# Patient Record
Sex: Male | Born: 1960 | Race: White | Hispanic: No | Marital: Married
Health system: Southern US, Community
[De-identification: ages and names within clinical notes are randomized; demographics above are authoritative.]

---

## 2001-10-14 ENCOUNTER — Emergency Department (HOSPITAL_COMMUNITY): Admission: EM | Admit: 2001-10-14 | Discharge: 2001-10-14 | Payer: Self-pay | Admitting: Emergency Medicine

## 2001-10-14 ENCOUNTER — Encounter: Payer: Self-pay | Admitting: Emergency Medicine

## 2007-01-03 ENCOUNTER — Emergency Department: Payer: Self-pay | Admitting: Emergency Medicine

## 2008-02-02 ENCOUNTER — Emergency Department: Payer: Self-pay | Admitting: Emergency Medicine

## 2008-02-02 ENCOUNTER — Ambulatory Visit: Payer: Self-pay | Admitting: Family Medicine

## 2010-03-19 ENCOUNTER — Ambulatory Visit: Payer: Self-pay | Admitting: Internal Medicine

## 2011-11-13 ENCOUNTER — Inpatient Hospital Stay: Payer: Self-pay | Admitting: Psychiatry

## 2013-07-12 ENCOUNTER — Observation Stay: Payer: Self-pay | Admitting: Internal Medicine

## 2013-07-12 LAB — URINALYSIS, COMPLETE
Bacteria: NONE SEEN
Bilirubin,UR: NEGATIVE
Blood: NEGATIVE
Glucose,UR: NEGATIVE mg/dL (ref 0–75)
Leukocyte Esterase: NEGATIVE
Nitrite: NEGATIVE
Ph: 7 (ref 4.5–8.0)
Protein: NEGATIVE
RBC,UR: 1 /HPF (ref 0–5)
Specific Gravity: 1.015 (ref 1.003–1.030)
Squamous Epithelial: NONE SEEN
WBC UR: <1 /HPF (ref 0–5)

## 2013-07-12 LAB — PROTIME-INR
INR: 1
Prothrombin Time: 13.6 secs (ref 11.5–14.7)

## 2013-07-12 LAB — COMPREHENSIVE METABOLIC PANEL
Albumin: 4.4 g/dL (ref 3.4–5.0)
Alkaline Phosphatase: 90 U/L (ref 50–136)
Anion Gap: 11 (ref 7–16)
BUN: 12 mg/dL (ref 7–18)
Bilirubin,Total: 1.2 mg/dL — ABNORMAL HIGH (ref 0.2–1.0)
Calcium, Total: 9.4 mg/dL (ref 8.5–10.1)
Chloride: 106 mmol/L (ref 98–107)
Co2: 22 mmol/L (ref 21–32)
Creatinine: 1.3 mg/dL (ref 0.60–1.30)
EGFR (African American): >60
EGFR (Non-African Amer.): >60
Glucose: 112 mg/dL — ABNORMAL HIGH (ref 65–99)
Osmolality: 278 (ref 275–301)
Potassium: 3.2 mmol/L — ABNORMAL LOW (ref 3.5–5.1)
SGOT(AST): 19 U/L (ref 15–37)
SGPT (ALT): 21 U/L (ref 12–78)
Sodium: 139 mmol/L (ref 136–145)
Total Protein: 7.5 g/dL (ref 6.4–8.2)

## 2013-07-12 LAB — CBC
HCT: 44.2 % (ref 40.0–52.0)
HGB: 14.3 g/dL (ref 13.0–18.0)
MCH: 27.9 pg (ref 26.0–34.0)
MCHC: 32.4 g/dL (ref 32.0–36.0)
MCV: 86 fL (ref 80–100)
Platelet: 326 x10 3/mm 3 (ref 150–440)
RBC: 5.13 x10 6/mm 3 (ref 4.40–5.90)
RDW: 14 % (ref 11.5–14.5)
WBC: 17.7 x10 3/mm 3 — ABNORMAL HIGH (ref 3.8–10.6)

## 2013-07-12 LAB — CK TOTAL AND CKMB (NOT AT ARMC)
CK, Total: 65 U/L (ref 35–232)
CK-MB: <0.5 ng/mL — ABNORMAL LOW (ref 0.5–3.6)

## 2013-07-12 LAB — TSH: Thyroid Stimulating Horm: 0.751 u[IU]/mL

## 2013-07-12 LAB — MAGNESIUM: Magnesium: 1.5 mg/dL — ABNORMAL LOW

## 2013-07-12 LAB — TROPONIN I: Troponin-I: <0.02 ng/mL

## 2013-07-13 LAB — LIPID PANEL
Cholesterol: 169 mg/dL
HDL Cholesterol: 40 mg/dL
Ldl Cholesterol, Calc: 108 mg/dL — ABNORMAL HIGH
Triglycerides: 107 mg/dL
VLDL Cholesterol, Calc: 21 mg/dL

## 2013-07-13 LAB — CK TOTAL AND CKMB (NOT AT ARMC)
CK, Total: 58 U/L
CK-MB: <0.5 ng/mL — ABNORMAL LOW
CK-MB: 1 ng/mL (ref 0.5–3.6)

## 2013-07-13 LAB — TROPONIN I
Troponin-I: <0.02 ng/mL
Troponin-I: <0.02 ng/mL

## 2014-05-18 DEATH — deceased

## 2014-12-14 IMAGING — CT CT HEAD WITHOUT CONTRAST
1 series · 16 of 30 positions shown, 20 images · non-contrast
Comparison: none

REASON FOR EXAM: confusion
COMMENTS:

PROCEDURE:     CT  - CT HEAD WITHOUT CONTRAST  - July 12, 2013  [DATE]
RESULT:     Technique: Helical 5mm sections were obtained from the skull
base to the vertex without administration of intravenous contrast.

[Series 2: soft tissue · axial · 0.42mm/px · z∈[-173,-28]mm · 16 of 33 slices shown, 20 images]
[im 2/33  brain]
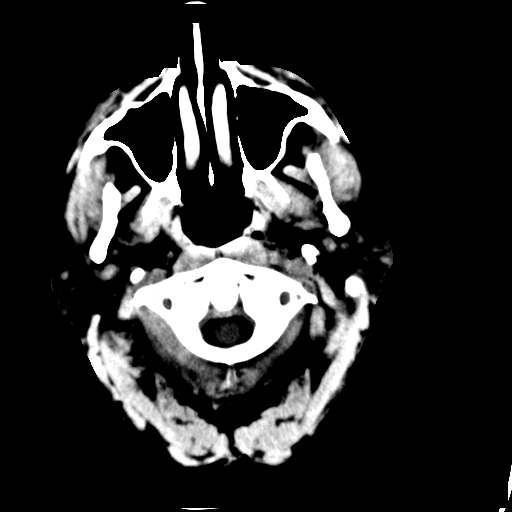
[im 2/33  bone]
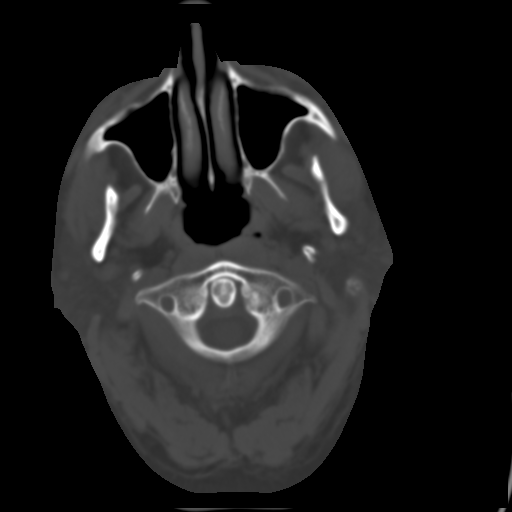
[im 4/33  brain]
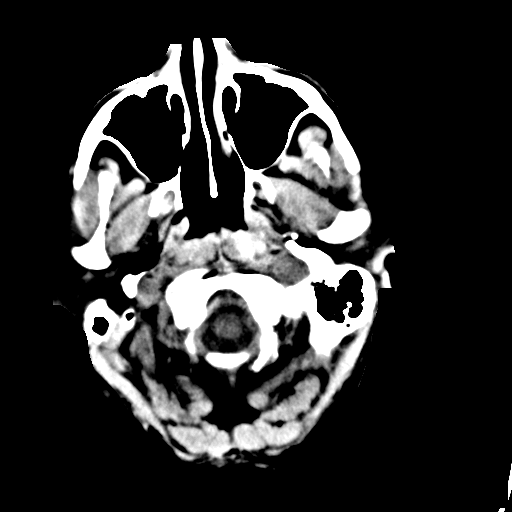
[im 6/33  brain]
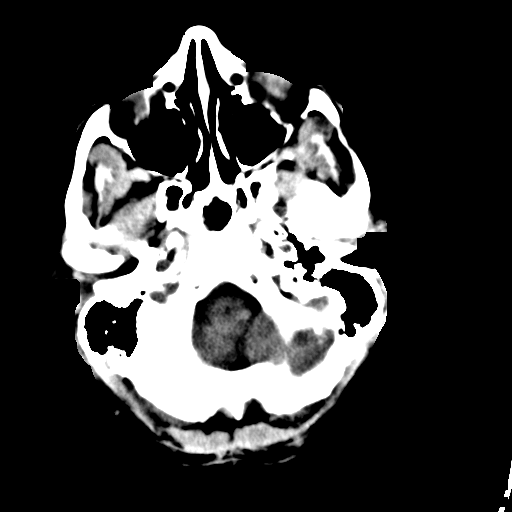
[im 8/33  brain]
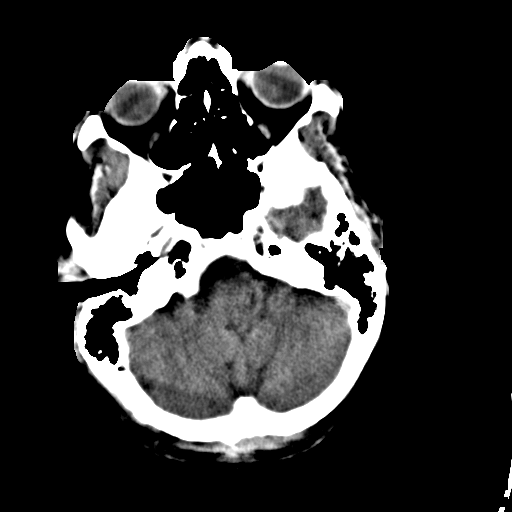
[im 9/33  brain]
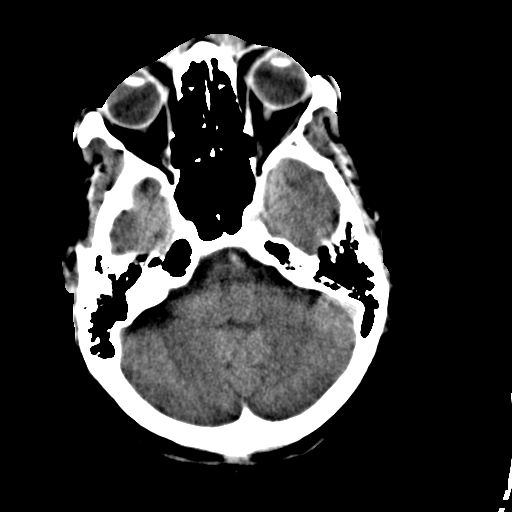
[im 9/33  bone]
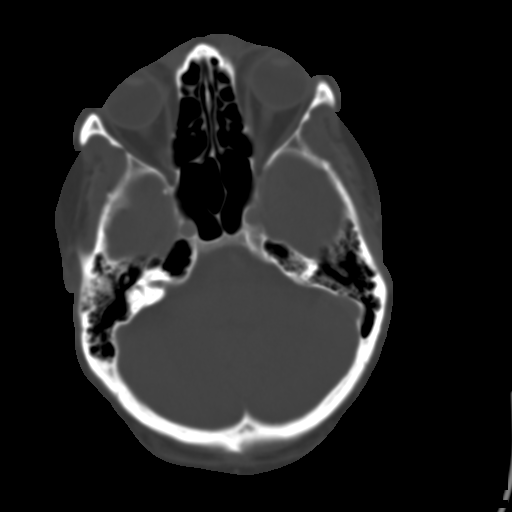
[im 12/33  brain]
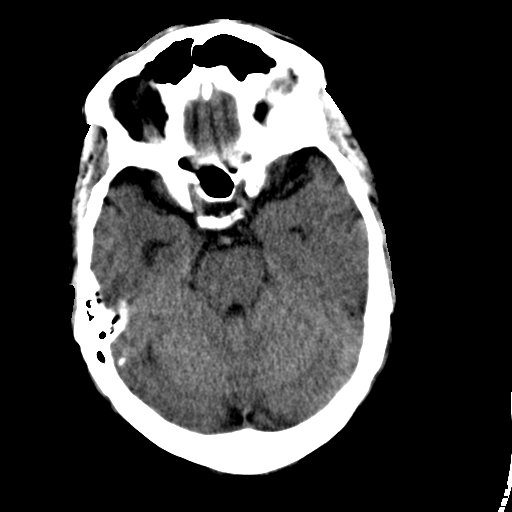
[im 14/33  brain]
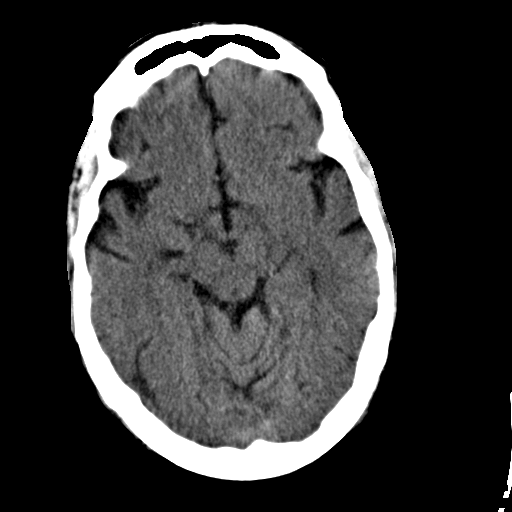
[im 16/33  brain]
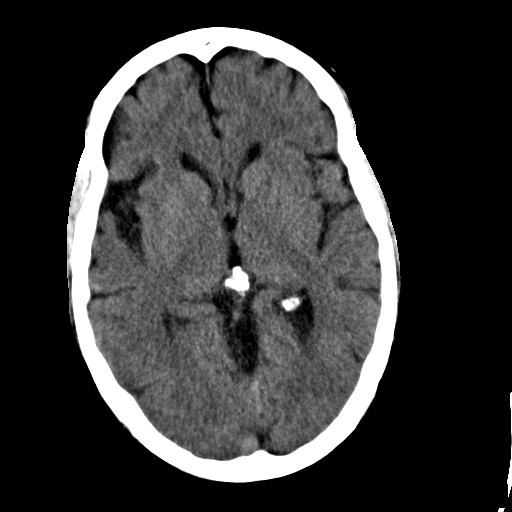
[im 17/33  brain]
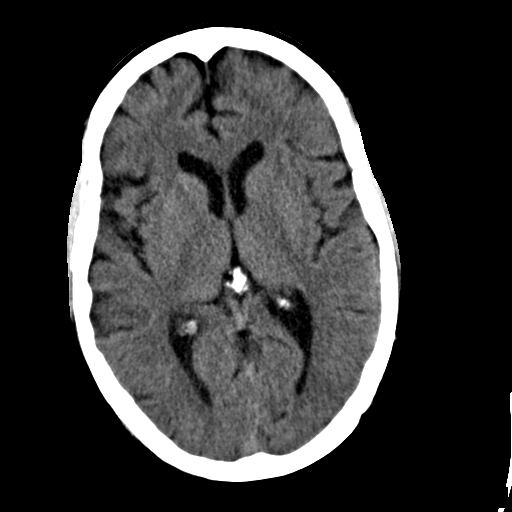
[im 17/33  bone]
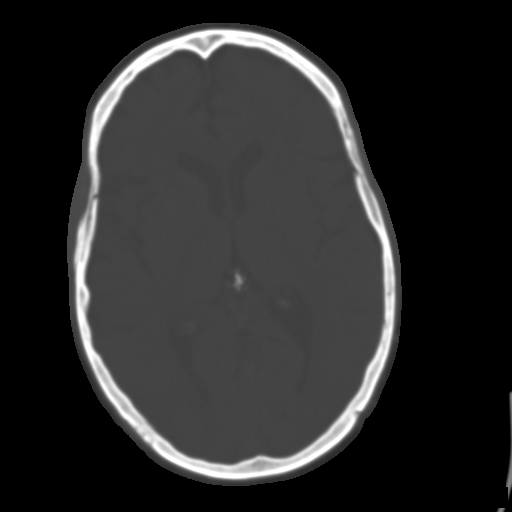
[im 19/33  brain]
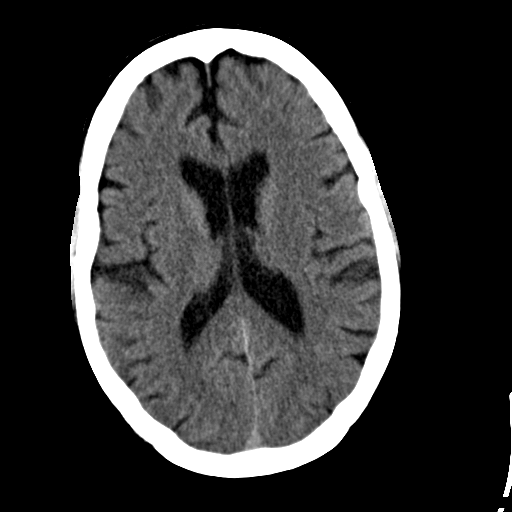
[im 21/33  brain]
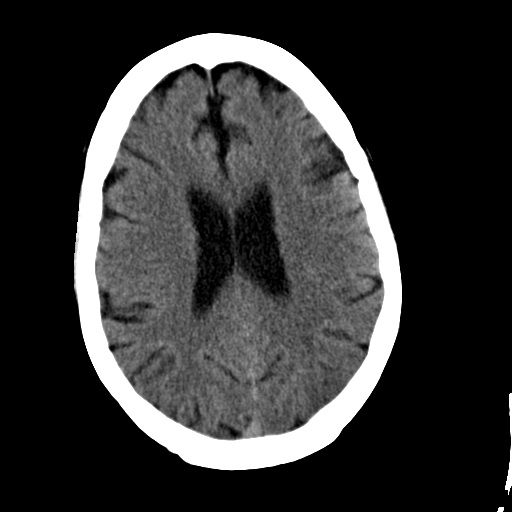
[im 24/33  brain]
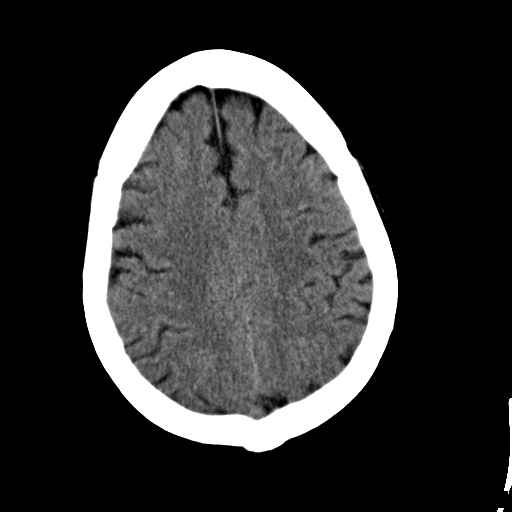
[im 25/33  brain]
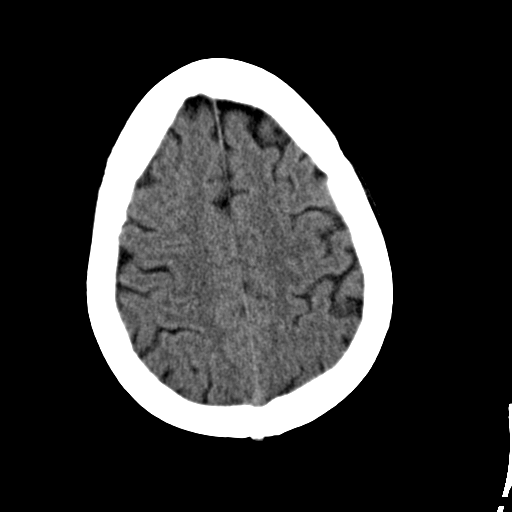
[im 25/33  bone]
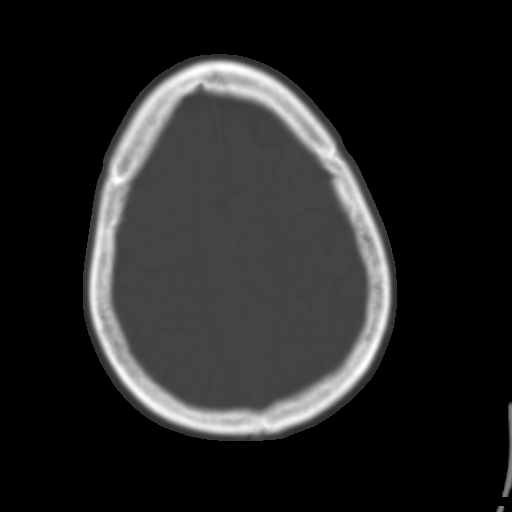
[im 27/33  brain]
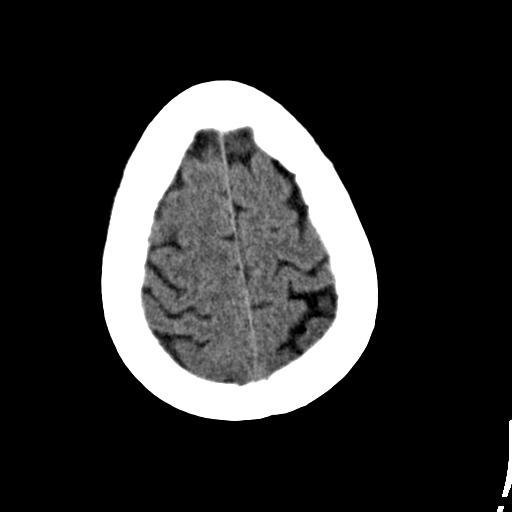
[im 29/33  brain]
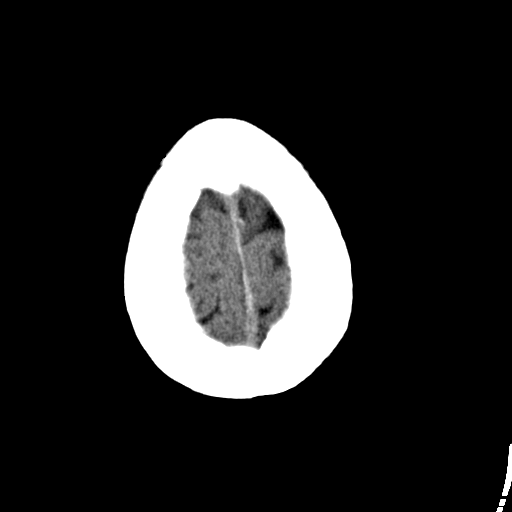
[im 31/33  brain]
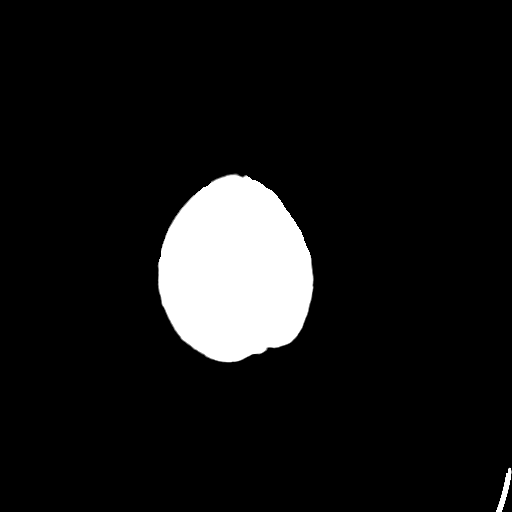

[16 of 30 positions shown; findings below may reference images not displayed]

FINDINGS: There is not evidence of intra-axial fluid collections. There is
no evidence of acute hemorrhage or secondary signs reflecting mass effect or
subacute or chronic focal territorial infarction. The osseous structures
demonstrate no evidence of a depressed skull fracture. If there is
persistent concern clinical follow-up with MRI is recommended.

Visualized paranasal sinuses and mastoid air cells are patent.
IMPRESSION: 1. No evidence of acute intracranial abnormalities.

## 2015-04-09 NOTE — Consult Note (Signed)
   Electronic Signatures: Duncan Dullullo, Teresa (MD)  (Signed on 26-Jul-14 21:23)  Authored  Last Updated: 26-Jul-14 21:23 by Duncan Dullullo, Teresa (MD)

## 2015-04-09 NOTE — Discharge Summary (Signed)
PATIENT NAME:  Caleb Lowe, Caleb Lowe DATE OF BIRTH:  09/07/1961  DATE OF ADMISSION:  07/12/2013 DATE OF DISCHARGE:  07/13/2013  DISCHARGE DIAGNOSES:  1.  Acute heat stroke.  2.  Hypokalemia and hypomagnesemia.  3.  Anxiety/depression.  4.  Gastroesophageal reflux disease.   DISCHARGE MEDICATIONS: Prozac 20 mg daily, clorazepate 7.5 mg 1-1/2 tabs at bedtime, Seroquel XR 50 mg at bedtime, ranitidine 150 mg b.i.d., potassium chloride 10 mEq b.i.d., magnesium oxide 400 mg daily.   REASON FOR ADMISSION: This 54 year old male presents with altered mental status and electrolyte disturbance. Please see H and P for history of present illness, past medical history, and physical exam.   HOSPITAL COURSE: The patient was admitted, hydrated, potassium and magnesium replaced, His right arm dysesthesias resolved with hydration and electrolyte resolution. Brain MRI showed no abnormality. Carotid Doppler unremarkable. Chest x-ray negative. He is back to baseline and will follow up with Dr. Barbette ReichmannVishwanath Hande this week.  ____________________________ Danella PentonMark F. Irlene Crudup, MD mfm:aw D: 07/14/2013 07:55:25 ET T: 07/14/2013 08:14:04 ET JOB#: [id number]  cc: Danella PentonMark F. Eaden Hettinger, MD, <Dictator> Nyjai Graff Sherlene ShamsF Colby Catanese MD ELECTRONICALLY SIGNED 07/15/2013 7:55

## 2015-04-09 NOTE — H&P (Signed)
PATIENT NAME:  Caleb Lowe, Caleb Lowe MR#:  [id number] DATE OF BIRTH:  10-Apr-1961  DATE OF ADMISSION:  07/12/2013  CHIEF COMPLAINT:  Acute confusion, right-sided weakness.   HISTORY OF PRESENT ILLNESS:  Mr. 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  The patient states that he was on his way back from the trash dumpster in his car and suddenly felt his hands go numb and became very confused.  He called his wife and told her that he did not know where he was and she remembers that his speech was not slurred.  She waited for him at home about 10 minutes and then when he did not make it home she went out looking for him.  During the interim, he was able to find his way home and called her again wondering where she was.  She returned home and found him sitting in a chair, both hands were spasming.  He was unable to straighten out his fingers.  She called EMS.  When EMS arrived he was lying on the floor with his eyes closed and was unable to open them, but he does remember talking to EMS.  While EMS was there he vomited.  He was brought to the Emergency Room and was examined by the ER doctor and found to have a new onset right-sided weakness.  Head CT was negative for infarct.  He is being admitted for a suspicion of TIA.   PRIMARY CARE PHYSICIAN:  [facility].  PAST MEDICAL HISTORY:   1.  Notable for chronic back pain since a back injury in January 2002.  [place].   2.  Per patient, he has sciatica and has not had any surgery.  3.  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   CURRENT MEDICATIONS:  Vitamin B12 500 mcg twice  daily.  Seroquel XR 50 mg once daily.  Fluoxetine 20 mg 1 tablet daily.  Clorazepate 7.5 mg tablet 1-1/2 tablets once daily.    PAST SURGICAL HISTORY:  He has had an appendectomy.   ALLERGIES:  SULFA CAUSES A RASH.   LAST HOSPITALIZATION:  [facility] for psychosis and suicidality.   FAMILY HISTORY:  Noncontributory.   SOCIAL HISTORY:  He is a lifelong nonsmoker.  He has occasional alcohol.  He is a Emergency planning/management officerpolice officer.   REVIEW OF SYSTEMS:   CONSTITUTIONAL:  Negative for fever and any weight changes.  He has chronic pain secondary to a back injury.  He has weakness noted today in right hand, but the patient is not aware of this.   HEENT:  No history of blurred or double vision.  No history of hearing loss, seasonal rhinitis or difficulty swallowing.   RESPIRATORY:  No history of cough, wheezing, hemoptysis or dyspnea.   CARDIOVASCULAR:  He does report chest pain since this afternoon in a band-like pattern around his entire circumference.  He denies any palpitations or dyspnea on exertion.  He denies having lost consciousness today.  GASTROINTESTINAL:  Positive for nausea and vomiting x 1 episode this afternoon.  He has no history of jaundice, rectal bleeding or change in bowel habits.  GENITOURINARY:  He denies dysuria, hematuria or incontinence.  He has no history of prostatitis or erectile dysfunction.   ENDOCRINE:  He has no history of polyuria, nocturia or thyroid problems.  HEMATOLOGY:  No history of anemia, easy bruising or bleeding.   MUSCULOSKELETAL:  He has chronic low back pain with sciatica to the right since January 2012.  NEUROLOGIC:  Positive for diffuse numbness noted sudden onset today.  This has improved.  He has no history of migraines or prior strokes.  No history of seizures or memory loss.  He does have a history of anxiety and is currently taking medications.   PHYSICAL EXAMINATION: GENERAL:  This is a well-nourished muscular male in no apparent  distress.  VITAL SIGNS:  Blood pressure is 112/71, pulse is 89 and regular, temperature is 97.6, respirations are 12 and he is sating 100% on room air.  HEENT:  Pupils are equal, round and reactive to light.  The extraocular movements are intact.  Sclerae are nonicteric.  Oropharynx is benign.  Mucosa is within normal limits.  Dentition is fine.  NECK:  Supple without lymphadenopathy, JVD, thyromegaly or carotid bruits.  LUNGS:  Clear to auscultation bilaterally with no wheezing or rhonchi.  CARDIOVASCULAR:  Regular rate and rhythm with no murmurs, rubs or gallops.  Pedal pulses are palpable and there is no lower extremity edema.  ABDOMEN:  Soft, nontender and nondistended with good bowel sounds and no evidence of hepatosplenomegaly.  MUSCULOSKELETAL:  He has 4+ out of 5 strength in the right upper extremity proximally and distally.  Left upper extremity is 5 out of 5.  Lower extremities, he has 4+ out of 5 in the right side and 5 out of 5 in the left.  Sensation is intact to light touch in all four appendages.  Deep tendon reflexes are symmetric.  SKIN:  Warm and dry without rashes or lesions.  LYMPHATIC:  There is no cervical, axillary, inguinal or supraclavicular lymphadenopathy.  NEUROLOGICAL:  Cranial nerves are intact.  There is no dysarthria.  Deep tendon reflexes are intact and there is no aphagia.  Sensation is intact as well.  He is alert and oriented to person, place and time.  He is cooperative.  Does not appear confused or agitated.   ADMISSION LABORATORY DATA:  Sodium 139, potassium 3.2, chloride 106, bicarb 22, BUN 12, creatinine 1.3, glucose 112, white count 17.7, hemoglobin 14.3, platelets 326, CK 65, MB is less than 0.5 and troponin is less than 0.02.  EKG shows normal sinus rhythm with prolonged QT intervals.  CT of the head shows no acute changes.   ASSESSMENT AND PLAN: 1.  Altered mental status, suspicion for transient ischemic attack raised after right-sided weakness failed to  respond with IV fluids given in the ED.  We will admit under observation status and obtain MRI of the brain, carotid Dopplers, echocardiogram and fasting lipids.  2.  Muscle spasm, likely secondary to heat stroke, although his CK was normal.  We will check his magnesium level and replace potassium.  3.  History of anxiety.  Continue Seroquel, fluoxetine and clorazepate.  4.  Leukocytosis secondary to episode of heat stroke.  His UA was normal and he has no signs on exam of pneumonia.   ESTIMATED TIME OF CARE:  40 minutes.    ____________________________ Duncan Dull, MD tt:ea D: 07/12/2013 21:33:02 ET T: 07/12/2013 23:03:46 ET JOB#: [id number]  cc: Duncan Dull, MD, <Dictator> Duncan Dull MD ELECTRONICALLY SIGNED 07/22/2013 10:35
